# Patient Record
Sex: Female | Born: 1960 | Race: White | Hispanic: No | Marital: Married | Smoking: Never smoker
Health system: Southern US, Community
[De-identification: ages and names within clinical notes are randomized; demographics above are authoritative.]

## PROBLEM LIST (undated history)

## (undated) DIAGNOSIS — I10 Essential (primary) hypertension: Secondary | ICD-10-CM

## (undated) DIAGNOSIS — C801 Malignant (primary) neoplasm, unspecified: Secondary | ICD-10-CM

## (undated) DIAGNOSIS — T7840XA Allergy, unspecified, initial encounter: Secondary | ICD-10-CM

## (undated) DIAGNOSIS — M810 Age-related osteoporosis without current pathological fracture: Secondary | ICD-10-CM

## (undated) DIAGNOSIS — F419 Anxiety disorder, unspecified: Secondary | ICD-10-CM

## (undated) DIAGNOSIS — E785 Hyperlipidemia, unspecified: Secondary | ICD-10-CM

## (undated) HISTORY — DX: Hyperlipidemia, unspecified: E78.5

## (undated) HISTORY — DX: Age-related osteoporosis without current pathological fracture: M81.0

## (undated) HISTORY — PX: DILATION AND CURETTAGE, DIAGNOSTIC / THERAPEUTIC: SUR384

## (undated) HISTORY — DX: Anxiety disorder, unspecified: F41.9

## (undated) HISTORY — DX: Essential (primary) hypertension: I10

## (undated) HISTORY — PX: CHOLECYSTECTOMY: SHX55

## (undated) HISTORY — DX: Allergy, unspecified, initial encounter: T78.40XA

## (undated) HISTORY — DX: Malignant (primary) neoplasm, unspecified: C80.1

---

## 1997-09-10 ENCOUNTER — Other Ambulatory Visit: Admission: RE | Admit: 1997-09-10 | Discharge: 1997-09-10 | Payer: Self-pay | Admitting: Obstetrics and Gynecology

## 1999-06-22 ENCOUNTER — Encounter (INDEPENDENT_AMBULATORY_CARE_PROVIDER_SITE_OTHER): Payer: Self-pay

## 1999-06-22 ENCOUNTER — Inpatient Hospital Stay (HOSPITAL_COMMUNITY): Admission: AD | Admit: 1999-06-22 | Discharge: 1999-06-25 | Payer: Self-pay | Admitting: Obstetrics and Gynecology

## 1999-06-26 ENCOUNTER — Encounter: Admission: RE | Admit: 1999-06-26 | Discharge: 1999-08-01 | Payer: Self-pay | Admitting: Obstetrics and Gynecology

## 2002-06-12 ENCOUNTER — Encounter: Admission: RE | Admit: 2002-06-12 | Discharge: 2002-06-12 | Payer: Self-pay | Admitting: Obstetrics and Gynecology

## 2002-06-12 ENCOUNTER — Encounter: Payer: Self-pay | Admitting: Obstetrics and Gynecology

## 2002-12-01 ENCOUNTER — Other Ambulatory Visit: Admission: RE | Admit: 2002-12-01 | Discharge: 2002-12-01 | Payer: Self-pay | Admitting: Obstetrics and Gynecology

## 2003-12-27 ENCOUNTER — Other Ambulatory Visit: Admission: RE | Admit: 2003-12-27 | Discharge: 2003-12-27 | Payer: Self-pay | Admitting: Obstetrics and Gynecology

## 2005-02-22 ENCOUNTER — Other Ambulatory Visit: Admission: RE | Admit: 2005-02-22 | Discharge: 2005-02-22 | Payer: Self-pay | Admitting: Obstetrics and Gynecology

## 2006-09-20 ENCOUNTER — Encounter: Admission: RE | Admit: 2006-09-20 | Discharge: 2006-09-20 | Payer: Self-pay | Admitting: Obstetrics and Gynecology

## 2007-08-20 ENCOUNTER — Ambulatory Visit: Payer: Self-pay | Admitting: Cardiology

## 2007-08-28 ENCOUNTER — Ambulatory Visit: Payer: Self-pay

## 2007-08-28 ENCOUNTER — Ambulatory Visit: Payer: Self-pay | Admitting: Cardiology

## 2007-08-28 ENCOUNTER — Encounter: Payer: Self-pay | Admitting: Cardiology

## 2007-08-28 LAB — CONVERTED CEMR LAB: TSH: 1.45 microintl units/mL (ref 0.35–5.50)

## 2007-10-22 ENCOUNTER — Ambulatory Visit: Payer: Self-pay | Admitting: Cardiology

## 2008-05-21 ENCOUNTER — Encounter (INDEPENDENT_AMBULATORY_CARE_PROVIDER_SITE_OTHER): Payer: Self-pay | Admitting: Obstetrics and Gynecology

## 2008-05-21 ENCOUNTER — Ambulatory Visit (HOSPITAL_COMMUNITY): Admission: RE | Admit: 2008-05-21 | Discharge: 2008-05-21 | Payer: Self-pay | Admitting: Obstetrics and Gynecology

## 2008-08-02 ENCOUNTER — Telehealth (INDEPENDENT_AMBULATORY_CARE_PROVIDER_SITE_OTHER): Payer: Self-pay | Admitting: *Deleted

## 2009-02-12 HISTORY — PX: COLONOSCOPY: SHX174

## 2009-06-18 IMAGING — US UNKNOWN US STUDY
1 series · 3 of 3 positions shown · non-contrast
Comparison: none

DG DIAGNOSTIC UNILATERAL L
CC and MLO view(s) were taken of the left breast.

LEFT BREAST ULTRASOUND
DIGITAL UNILATERAL LEFT DIAGNOSTIC MAMMOGRAM AND LEFT BREAST ULTRASOUND:
CLINICAL DATA: 46-year-old with possible mass identified in the left breast on screening mammogram
September 12, 2006.

[Series 1: unknown us study · 3 of 3 slices shown]
[im 1/3]
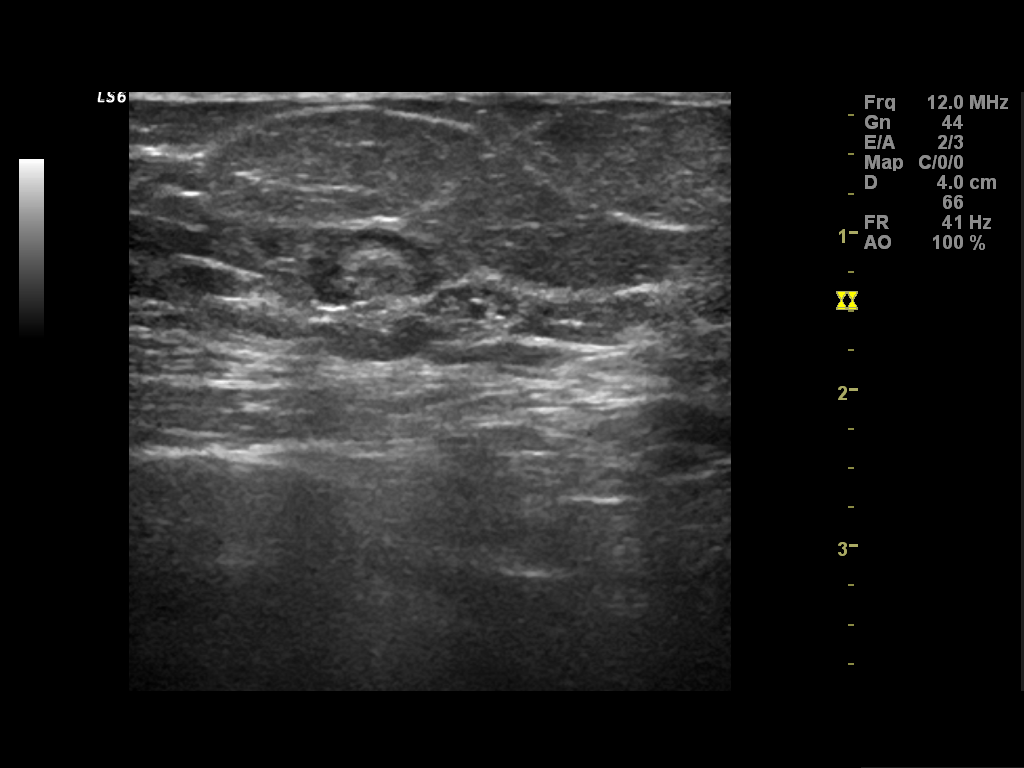
[im 2/3]
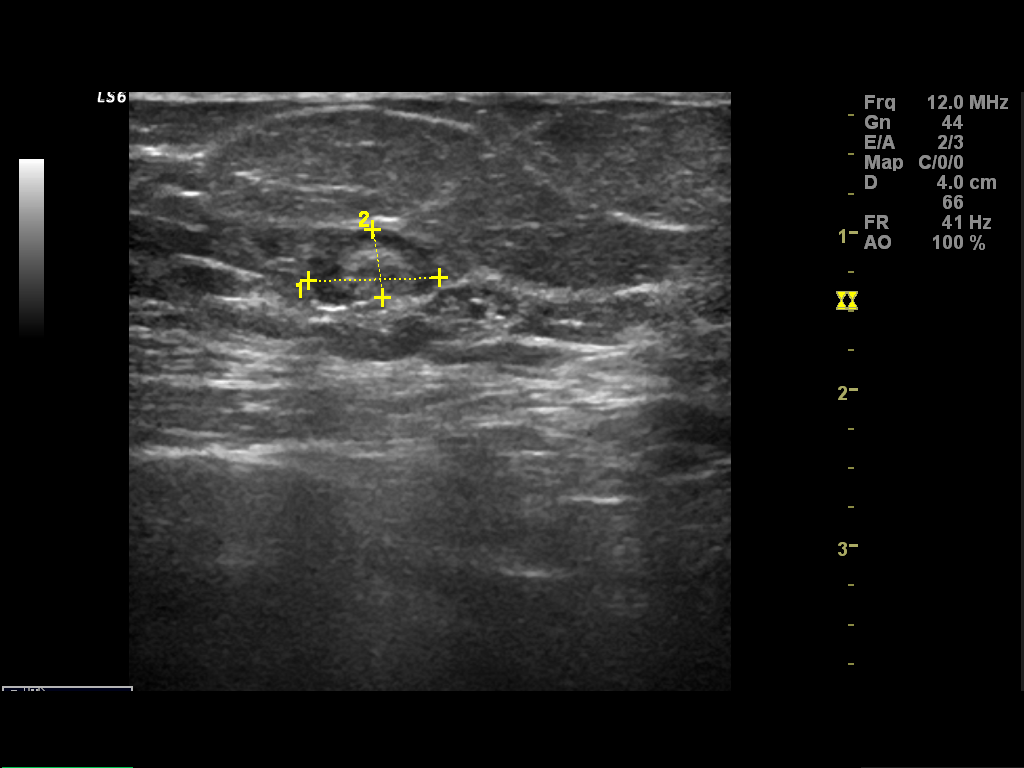
[im 3/3]
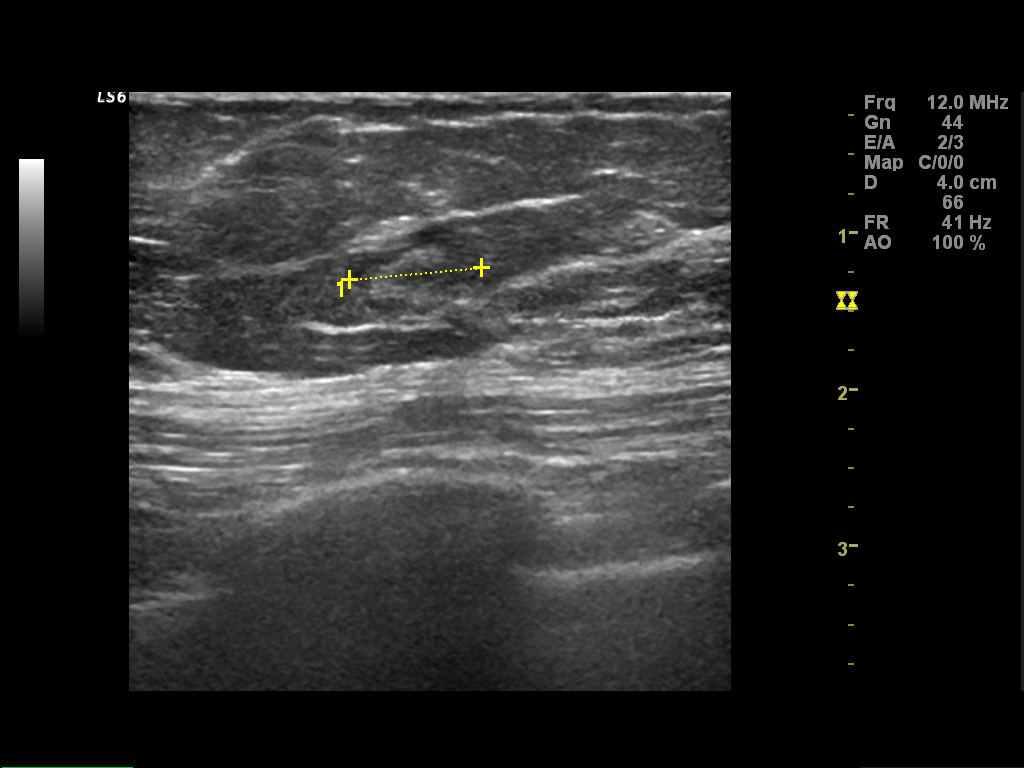

[3 of 3 positions shown; findings below may reference images not displayed]

Spot compression views of the upper outer quadrant of the right breast show heterogeneously dense 
breast parenchyma in the upper outer quadrant.  Particularly on the CC view, there is suggestion of
persistence of an oval-shaped mass that measures approximately 1 cm and has suggestion of a 
central fatty hilum, although the mass is obscured by overlapping fibroglandular breast tissue.

Physical examination of the upper outer quadrant of the left breast is unremarkable.

Ultrasound of the upper outer quadrant of the left breast shows a smoothly marginated oval-shaped 
mass with central echogenicity and peripheral hypoechogenicity consistent with an intramammary 
lymph node.  This mass measures 0.8 x 0.4 x 0.8 cm and is located at the 2 o'clock position 5 cm 
from the nipple.  This mass corresponds in size, shape and location to the finding seen on the 
recent screening mammogram.  No worrisome masses or cysts are identified in the upper outer 
quadrant on ultrasound.
IMPRESSION: The oval-shaped mass seen in the upper outer quadrant of the left breast on recent screening 
mammogram was confirmed to be an intramammary lymph node, a benign finding.

ASSESSMENT: Benign - BI-RADS 2

Screening mammogram of both breasts in 1 year.
,

## 2009-09-27 ENCOUNTER — Encounter (INDEPENDENT_AMBULATORY_CARE_PROVIDER_SITE_OTHER): Payer: Self-pay | Admitting: *Deleted

## 2009-11-01 ENCOUNTER — Encounter (INDEPENDENT_AMBULATORY_CARE_PROVIDER_SITE_OTHER): Payer: Self-pay | Admitting: *Deleted

## 2009-11-03 ENCOUNTER — Ambulatory Visit: Payer: Self-pay | Admitting: Internal Medicine

## 2009-11-14 ENCOUNTER — Ambulatory Visit: Payer: Self-pay | Admitting: Internal Medicine

## 2010-03-14 NOTE — Miscellaneous (Signed)
Summary: fam hx col ..em  Clinical Lists Changes  Medications: Added new medication of MOVIPREP 100 GM  SOLR (PEG-KCL-NACL-NASULF-NA ASC-C) As directed - Signed Rx of MOVIPREP 100 GM  SOLR (PEG-KCL-NACL-NASULF-NA ASC-C) As directed;  #1 x 0;  Signed;  Entered by: Clide Cliff RN;  Authorized by: Iva Boop MD, [facility];  Method used: Electronically to [facility] [address], [place], [place]  [address], Ph: [phone], Fax: [phone] Allergies: Added new allergy or adverse reaction of BACTRIM Observations: Added new observation of NKA: F (11/03/2009 8:54) Added new observation of ALLERGY REV: Done (11/03/2009 8:54)    Prescriptions: MOVIPREP 100 GM  SOLR (PEG-KCL-NACL-NASULF-NA ASC-C) As directed  #1 x 0   Entered by:   Clide Cliff RN   Authorized by:   Iva Boop MD, [facility]   Signed by:   Clide Cliff RN on 11/03/2009   Method used:   Electronically to        [facility] (retail)       [address]       [place], [place]  [address]       Ph: [phone]       Fax: [phone]   RxID:   [id number]

## 2010-03-14 NOTE — Letter (Signed)
Summary: Pre Visit Letter Revised  [facility]  [address] [place], [place] [address]   Phone: [phone]  Fax: [phone]    09/27/2009 MRN: [id number]  [facility] [address] [place], [place]  [address]              Procedure Date: November 14, 2009   Welcome to the Gastroenterology Division at [facility].    You are scheduled to see a nurse for your pre-procedure visit on September 22, 2011at 9:00am on the 3rd floor at [facility], [address].  We ask that you try to arrive at our office 15 minutes prior to your appointment time to allow for check-in.  Please take a minute to review the attached form.  If you answer "Yes" to one or more of the questions on the first page, we ask that you call the person listed at your earliest opportunity.  If you answer "No" to all of the questions, please complete the rest of the form and bring it to your appointment.    Your nurse visit will consist of discussing your medical and surgical history, your immediate family medical history, and your medications.    If you are unable to list all of your medications on the form, please bring the medication bottles to your appointment and we will list them.  We will need to be aware of both prescribed and over the counter drugs.  We will need to know exact dosage information as well.    Please be prepared to read and sign documents such as consent forms, a financial agreement, and acknowledgement forms.  If necessary, and with your consent, a friend or relative is welcome to sit-in on the nurse visit with you.  Please bring your insurance card so that we may make a copy of it.  If your insurance requires a referral to see a specialist, please bring your referral form from your primary care physician.  No co-pay is required for this nurse visit.     If you cannot keep your appointment, please call [phone] to cancel or reschedule prior to your appointment date.   This allows [place] the opportunity to schedule an appointment for another patient in need of care.   Thank you for choosing  Gastroenterology for your medical needs.  We appreciate the opportunity to care for you.  Please visit [place] at our website  to learn more about our practice.                     Sincerely,  The Gastroenterology Division

## 2010-03-14 NOTE — Procedures (Signed)
Summary: Colonoscopy  Patient: [name] Note: All result statuses are Final unless otherwise noted.  Tests: (1) Colonoscopy (COL)   COL Colonoscopy           DONE     [facility]     [address]     [place], [place]  [address]           COLONOSCOPY PROCEDURE REPORT           PATIENT:  Ana Ritter, Ana Ritter  MR#:  [id number]     BIRTHDATE:  1960/08/25, 49 yrs. old  GENDER:  female     ENDOSCOPIST:  Iva Boop, MD, [facility]     REF. BY:  Richardean Chimera, M.D.     PROCEDURE DATE:  11/14/2009     PROCEDURE:  Colonoscopy [address]     ASA CLASS:  Class I     INDICATIONS:  Routine Risk Screening She does have a family hx of     colon cancer in paternal grandmother (had it twice, in her 80's     then second time in 37's). No others with polyps or colon cancer.           MEDICATIONS:   Fentanyl 75 mcg IV, Versed 8 mg IV           DESCRIPTION OF PROCEDURE:   After the risks benefits and     alternatives of the procedure were thoroughly explained, informed     consent was obtained.  Digital rectal exam was performed and     revealed no abnormalities.   The LB CF-[id number] endoscope     was introduced through the anus and advanced to the cecum, which     was identified by both the appendix and ileocecal valve, without     limitations.  The quality of the prep was excellent, using     MoviPrep.  The instrument was then slowly withdrawn as the colon     was fully examined. Insertion: 3:04 minutes Withdrawal: 10:05     minutes     <<PROCEDUREIMAGES>>           FINDINGS:  A normal appearing cecum, ileocecal valve, and     appendiceal orifice were identified. The ascending, hepatic     flexure, transverse, splenic flexure, descending, sigmoid colon,     and rectum appeared unremarkable.   Retroflexed views in the     rectum revealed no abnormalities.    The scope was then withdrawn     from the patient and the procedure completed.           COMPLICATIONS:  None     ENDOSCOPIC  IMPRESSION:     1) Normal colonoscopy, excellent prep           REPEAT EXAM:  In 10 year(s) for routine screening colonoscopy.     (Only one second-degree relative with colon cancer).           Iva Boop, MD, [facility]           CC:  Richardean Chimera, MD     Olivia Canter, MD     The Patient           n.     Rosalie Doctor:   Iva Boop at 11/14/2009 11:53 AM           Jens Som, [id number]  Note: An exclamation mark (!) indicates a result that was not dispersed into the flowsheet. Document Creation Date: 11/14/2009  11:53 AM _______________________________________________________________________  (1) Order result status: Final Collection or observation date-time: 11/14/2009 11:38 Requested date-time:  Receipt date-time:  Reported date-time:  Referring Physician:   Ordering Physician: Stan Head [phone]) Specimen Source:  Source: Launa Grill Order Number: [id number] Lab site:   Appended Document: Colonoscopy    Clinical Lists Changes  Observations: Added new observation of COLONNXTDUE: 11/2019 (11/14/2009 14:21)

## 2010-03-14 NOTE — Letter (Signed)
Summary: Moviprep Instructions  [facility]  [address]   [place], [place] [address]   Phone: [phone]  Fax: [phone]       Ana Ritter    1960-03-08    MRN: [id number]        Procedure Day /Date: Monday, 11-14-09     Arrival Time: 10:30 a.m.     Procedure Time: 11:30 a.m.     Location of Procedure:                    x   [facility] (4th Floor)   PREPARATION FOR COLONOSCOPY WITH MOVIPREP   Starting 5 days prior to your procedure 11-09-09  do not eat nuts, seeds, popcorn, corn, beans, peas,  salads, or any raw vegetables.  Do not take any fiber supplements (e.g. Metamucil, Citrucel, and Benefiber).  THE DAY BEFORE YOUR PROCEDURE         DATE: 11-13-09   DAY: Sunday  1.  Drink clear liquids the entire day-NO SOLID FOOD  2.  Do not drink anything colored red or purple.  Avoid juices with pulp.  No orange juice.  3.  Drink at least 64 oz. (8 glasses) of fluid/clear liquids during the day to prevent dehydration and help the prep work efficiently.  CLEAR LIQUIDS INCLUDE: Water Jello Ice Popsicles Tea (sugar ok, no milk/cream) Powdered fruit flavored drinks Coffee (sugar ok, no milk/cream) Gatorade Juice: apple, white grape, white cranberry  Lemonade Clear bullion, consomm, broth Carbonated beverages (any kind) Strained chicken noodle soup Hard Candy                             4.  In the morning, mix first dose of MoviPrep solution:    Empty 1 Pouch A and 1 Pouch B into the disposable container    Add lukewarm drinking water to the top line of the container. Mix to dissolve    Refrigerate (mixed solution should be used within 24 hrs)  5.  Begin drinking the prep at 5:00 p.m. The MoviPrep container is divided by 4 marks.   Every 15 minutes drink the solution down to the next mark (approximately 8 oz) until the full liter is complete.   6.  Follow completed prep with 16 oz of clear liquid of your choice (Nothing red or purple).   Continue to drink clear liquids until bedtime.  7.  Before going to bed, mix second dose of MoviPrep solution:    Empty 1 Pouch A and 1 Pouch B into the disposable container    Add lukewarm drinking water to the top line of the container. Mix to dissolve    Refrigerate  THE DAY OF YOUR PROCEDURE      DATE: 11-14-09 DAY: Monday  Beginning at 6:30 a.m. (5 hours before procedure):         1. Every 15 minutes, drink the solution down to the next mark (approx 8 oz) until the full liter is complete.  2. Follow completed prep with 16 oz. of clear liquid of your choice.    3. You may drink clear liquids until  9:30 a.m.  (2 HOURS BEFORE PROCEDURE).   MEDICATION INSTRUCTIONS  Unless otherwise instructed, you should take regular prescription medications with a small sip of water   as early as possible the morning of your procedure.           OTHER INSTRUCTIONS  You will  need a responsible adult at least 48 years of age to accompany you and drive you home.   This person must remain in the waiting room during your procedure.  Wear loose fitting clothing that is easily removed.  Leave jewelry and other valuables at home.  However, you may wish to bring a book to read or  an iPod/MP3 player to listen to music as you wait for your procedure to start.  Remove all body piercing jewelry and leave at home.  Total time from sign-in until discharge is approximately 2-3 hours.  You should go home directly after your procedure and rest.  You can resume normal activities the  day after your procedure.  The day of your procedure you should not:   Drive   Make legal decisions   Operate machinery   Drink alcohol   Return to work  You will receive specific instructions about eating, activities and medications before you leave.    The above instructions have been reviewed and explained to me by   Clide Cliff, RN_____________________    I fully understand and can verbalize  these instructions _____________________________ Date _________

## 2010-05-24 LAB — CBC
HCT: 41.5 % (ref 36.0–46.0)
Hemoglobin: 14.2 g/dL (ref 12.0–15.0)
RBC: 4.23 MIL/uL (ref 3.87–5.11)
RDW: 12.7 % (ref 11.5–15.5)

## 2010-05-24 LAB — HCG, SERUM, QUALITATIVE: Preg, Serum: NEGATIVE

## 2010-06-27 NOTE — Assessment & Plan Note (Signed)
[facility]                            CARDIOLOGY OFFICE NOTE   NAME:Alden, KALIKA SMAY                      MRN:          [id number]  DATE:08/20/2007                            DOB:          03-08-1960    HISTORY OF PRESENT ILLNESS:  The patient is a very pleasant 50 year old  female with no prior cardiac history who I am asked to evaluate for  palpitations.  Note, she typically does not have dyspnea on exertion,  orthopnea, PND, pedal edema, presyncope, syncope, or exertional chest  pain.  Over the past several months, she has had intermittent  palpitations.  She does state that she has been under stress recently  with her husband's salary decreasing due to loss of business, and she  also has gone back to work but has been working in [employer] for  approximately 25 years.  She states that she has had 6 or 7 different  episodes of palpitations over that time.  They are sudden in onset and  described as heart racing.  There is no associated chest pain, shortness  of breath, or presyncope.  These typically last for approximately 2  minutes and then resolved spontaneously.  They are not related to  exertion.  She feels fine afterwards.  Because of the above, we were  asked to further evaluate.   MEDICATIONS AT PRESENT:  Multivitamin, flaxseed, and melatonin.  She  also takes Motrin and Aleve as needed.   ALLERGIES:  She has an allergy to BACTRIM.   SOCIAL HISTORY:  She has a remote history of tobacco use but has not  smoked since 1996.  She occasionally consumes alcohol.  She drinks  significant amounts of caffeine.   FAMILY HISTORY:  Negative for coronary artery disease.   PAST MEDICAL HISTORY:  There is no diabetes mellitus or hypertension,  and there is no hyperlipidemia.  She has had a prior cholecystectomy.  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   REVIEW OF SYSTEMS:  She denies any headaches, fevers,  or chills.  There  is no productive cough or hemoptysis.  There is no dysphagia,  odynophagia, melena, or hematochezia.  There is no dysuria or hematuria.  There is no rashes or seizure activity.  There is no orthopnea, PND, or  pedal edema.  The remaining systems are negative.   PHYSICAL EXAMINATION:  VITAL SIGNS:  Blood pressure 133/82 and pulse is  97.  GENERAL:  She is well-developed, well-nourished in no acute distress.  SKIN:  Warm and dry.  She is not appeared to be depressed.  There is no  peripheral clubbing.  BACK:  Normal.  HEENT:  Normal with normal eyelids.  NECK:  Supple with normal upstroke bilaterally.  There are no bruits  noted.  There is no jugular venous distention.  I cannot appreciate  thyromegaly.  CHEST:  Chest is clear to auscultation and expansion.  CARDIOVASCULAR:  Regular rate and rhythm.  Normal S1 and S2.  There are  no murmurs, rubs, or gallops noted.  There is no change  with Valsalva.  ABDOMEN:  Nontender, nondistended, and Positive bowel sounds.  No  hepatosplenomegaly.  No mass appreciated.  There is no abdominal bruit.  EXTREMITIES:  She has 2+ femoral pulses bilaterally.  No bruits.  Extremities showed no edema.  I could palpate no cords.  She has 2+  dorsalis pedis pulses bilaterally.  NEUROLOGIC:  Grossly intact.   Her electrocardiogram today shows a sinus rhythm at a rate of 97.  The  axis is normal.  There are no significant ST changes.  There is no delta  wave noted.   DIAGNOSES:  1. Palpitations - Ms. Kwiatek's description sounds concerning for an      supraventricular tachycardia.  We will plan to proceed with a      CardioNet monitor.  If identified, then we can either add a beta      blocker or consider referral for ablation in the future.  We will      check an echocardiogram to quantify left ventricular function as      well.  We will also check a TSH.  I have instructed her to decrease      her caffeine intake as much as possible.   We will see her back in      approximately 4 weeks to review her echo and her CardioNet monitor.     Madolyn Frieze Jens Som, MD, [facility]  Electronically Signed    BSC/MedQ  DD: 08/20/2007  DT: 08/21/2007  Job #: [id number]   cc:   Juluis Mire, M.D.  Olivia Canter, MD

## 2010-06-27 NOTE — Op Note (Signed)
Ana Ritter, Ana Ritter               ACCOUNT NO.:  [id number]   MEDICAL RECORD NO.:  [id number]          PATIENT TYPE:  AMB   LOCATION:  SDC                           FACILITY:  [facility]   PHYSICIAN:  Juluis Mire, M.D.   DATE OF BIRTH:  [birth year]   DATE OF PROCEDURE:  05/21/2008  DATE OF DISCHARGE:                               OPERATIVE REPORT   PREOPERATIVE DIAGNOSIS:  Endometrial polyp.   POSTOPERATIVE DIAGNOSIS:  Endometrial polyp.   PROCEDURE:  Cervical dilatation, hysteroscopic evaluation with multiple  endometrial biopsies using curette.   SURGEON:  Juluis Mire, MD   ANESTHESIA:  General.   ESTIMATED BLOOD LOSS:  Minimal.   PACKS AND DRAINS:  None.   INTRAOPERATIVE BLOOD PLACED:  None.   COMPLICATIONS:  None.   INDICATIONS:  Are dictated in history and physical.   PROCEDURE IN DETAIL:  The patient was taken to the OR and placed in  supine position.  After satisfactory level of general anesthesia  obtained, the patient was placed in dorsal position using the Allen  stirrups.  The perineum and vagina prepped out with Betadine and draped  in sterile field.  A speculum was placed in the vaginal vault.  The  cervix was grasped with a single-tooth tenaculum.  A paracervical block  1% Xylocaine was instituted.  Uterus sounded to 10 cm.  Cervix serially  dilated to a size of 35 Pratt dilator.  Hysteroscope was then  introduced.  Visualization revealed normal endometrium.  The polyp may  had been disrupted by the dilation.  We did multiple endometrial  biopsies along with curettings.  These were all sent for pathology.  Total deficit was 70 mL.  The single-tooth tenaculum and speculum were  removed.  The patient was taken out of dorsal position, once alert and  extubated, transferred to recovery room in good condition.  Sponge,  instrument, and needle counts were correct by circulating nurse.      Juluis Mire, M.D.  Electronically Signed     JSM/MEDQ  D:   05/21/2008  T:  05/21/2008  Job:  [id number]

## 2010-06-27 NOTE — H&P (Signed)
NAME:  Ana Ritter, Ana Ritter               ACCOUNT NO.:  [id number]   MEDICAL RECORD NO.:  [id number]          PATIENT TYPE:  AMB   LOCATION:  SDC                           [facility]ILITY:  [facility]   PHYSICIAN:  Juluis Mire, M.D.   DATE OF BIRTH:  1960/05/04   DATE OF ADMISSION:  05/21/2008  DATE OF DISCHARGE:                              HISTORY & PHYSICAL   The patient is a 50 year old gravida 4, para 2, abortus 2 female who  presents for hysteroscopy.   In relation to the present admission, the patient's last Pap smear did  reveal endometrial cells.  The saline infusion ultrasound revealed  evidence of a large endometrial polyp.  In view of this, we have resumed  for a hysteroscopic resection.   In terms of allergies, she is allergic to BACTRIM.   MEDICATIONS:  She is on melatonin 3 mg at night, calcium replacement,  flaxseed oil.  She is on a multivitamin, progesterone cream.  She uses  Premarin vaginal cream as well as a steroid ointment.   PAST MEDICAL HISTORY:  Usual childhood disease.  No significant  sequelae.  No previous surgical history.   OBSTETRICAL HISTORY:  Two vaginal deliveries.   FAMILY HISTORY:  Noncontributory.   SOCIAL HISTORY:  Reveals no tobacco or alcohol use.   REVIEW OF SYSTEMS:  Noncontributory.   PHYSICAL EXAMINATION:  VITAL SIGNS:  The patient is afebrile with stable  vital signs.  HEENT:  The patient is normocephalic.  Pupils are equal, round, and  reactive to light and accommodation.  Extraocular movements were intact.  Sclerae and conjunctivae are clear.  Oropharynx clear.  NECK:  Without thyromegaly.  BREASTS:  Not examined.  LUNGS:  Clear.  CARDIOVASCULAR SYSTEM:  Regular rhythm and rate, without murmurs or  gallops.  ABDOMEN:  Benign.  No mass, organomegaly, or tenderness.  PELVIC:  Normal external genitalia.  Vaginal mucosa is clear.  Cervix  unremarkable.  Uterus normal size, shape, and contour.  Adnexa free of  masses or tenderness.  EXTREMITIES:  Trace edema.  NEUROLOGIC:  Grossly within normal limits.   IMPRESSION:  Endometrial polyp.   PLAN:  The patient to undergo hysteroscopic resection and the nature of  the procedure have been discussed.  The risks have been explained.  The  risks include the risk of infection, risk of vascular injury that could  lead to hemorrhage requiring transfusion with the risk of AIDS or  hepatitis.  Excessive bleeding could require  hysterectomy.  There is a risk of perforation leading to injury to  adjacent organs requiring further exploratory surgery.  Risk of deep  venous thrombosis and pulmonary embolus.  The patient expressed an  understanding of risk as well as indications.      Juluis Mire, M.D.  Electronically Signed     JSM/MEDQ  D:  05/21/2008  T:  05/21/2008  Job:  [id number]

## 2010-06-27 NOTE — Assessment & Plan Note (Signed)
[facility]                            CARDIOLOGY OFFICE NOTE   NAME:Ana Ritter, Ana Ritter                      MRN:          [id number]  DATE:10/22/2007                            DOB:          [birth year]    Ana Ritter is an extremely pleasant 50 year old female who I recently  saw on August 20, 2007 secondary to the palpitations.  We did schedule her  to an echocardiogram which was performed on August 28, 2007.  Her LV  function was normal.  There was no significant valvular abnormalities.  She also had a TSH which was normal at 1.45.  We did place a CardioNet  monitor.  She had significant problems with lead sticking.  She states  she works for 4-5 days.  She thinks that one night she may have had  palpitations but is not clear that she had her previous palpitations  while the monitor was in place.  This did show sinus to sinus  tachycardia.  There were no significant arrhythmias.  She does state  that she has decreased her caffeine intake and her palpitations have  improved.  She denies any dyspnea, chest pain, or syncope.  She did have  a fall over the Labor Day weekend tripping over steps.  She displaced 2  teeth and fractured her mandible.   MEDICATIONS:  1. Flaxseed.  2. Melatonin.  3. Vitamin E.  4. [facility] [employer] [facility].  5. Pure Progesterone cream b.i.d.  6. Amoxicillin.   PHYSICAL EXAMINATION:  VITAL SIGNS:  Today shows a blood pressure of  110/74 and pulse of 87.  She weighs 160 pounds.  HEENT:  Normal.  NECK:  Supple.  CHEST:  Clear.  CARDIOVASCULAR:  Regular rate and rhythm.  ABDOMEN:  No tenderness.  EXTREMITIES:  No tenderness.   DIAGNOSES:  Palpitations - Ana Ritter's palpitations have markedly  improved.  We did not identify an arrhythmia with her CardioNet monitor  but it is not clear to me that she had the palpitations while it was in  place.  Given that they improve, we have elected to follow this  expectantly.  If they  worsen in the future, then we could try a beta-  blocker versus a repeating her monitor.  She is in agreement with that.  She thinks that a significant amount of this may be  related to stress.  We have asked her to come back on an as needed  basis.  If she does have worsening palpitations, then she will contact  us.     Ana Frieze Jens Som, MD, [facility]  Electronically Signed    BSC/MedQ  DD: 10/22/2007  DT: 10/23/2007  Job #: [id number]   cc:   Ana Ritter, M.D.  Ana Canter, MD

## 2013-09-24 ENCOUNTER — Encounter: Payer: Self-pay | Admitting: Internal Medicine

## 2014-04-29 ENCOUNTER — Other Ambulatory Visit: Payer: Self-pay | Admitting: Obstetrics and Gynecology

## 2014-05-03 LAB — CYTOLOGY - PAP

## 2019-09-05 DIAGNOSIS — N3281 Overactive bladder: Secondary | ICD-10-CM | POA: Insufficient documentation

## 2019-09-05 DIAGNOSIS — E559 Vitamin D deficiency, unspecified: Secondary | ICD-10-CM | POA: Insufficient documentation

## 2019-09-05 DIAGNOSIS — M9390 Osteochondropathy, unspecified of unspecified site: Secondary | ICD-10-CM | POA: Insufficient documentation

## 2019-09-05 DIAGNOSIS — H04129 Dry eye syndrome of unspecified lacrimal gland: Secondary | ICD-10-CM | POA: Insufficient documentation

## 2019-09-05 DIAGNOSIS — J309 Allergic rhinitis, unspecified: Secondary | ICD-10-CM | POA: Insufficient documentation

## 2019-09-05 DIAGNOSIS — E782 Mixed hyperlipidemia: Secondary | ICD-10-CM | POA: Insufficient documentation

## 2019-09-05 DIAGNOSIS — I10 Essential (primary) hypertension: Secondary | ICD-10-CM | POA: Insufficient documentation

## 2019-09-05 DIAGNOSIS — M810 Age-related osteoporosis without current pathological fracture: Secondary | ICD-10-CM | POA: Insufficient documentation

## 2019-09-05 DIAGNOSIS — F5104 Psychophysiologic insomnia: Secondary | ICD-10-CM | POA: Insufficient documentation

## 2019-11-04 ENCOUNTER — Encounter: Payer: Self-pay | Admitting: Obstetrics and Gynecology

## 2020-03-10 ENCOUNTER — Encounter: Payer: Self-pay | Admitting: Internal Medicine

## 2020-04-01 ENCOUNTER — Encounter: Payer: Self-pay | Admitting: Internal Medicine

## 2020-04-20 ENCOUNTER — Encounter: Payer: Self-pay | Admitting: *Deleted

## 2020-05-06 ENCOUNTER — Ambulatory Visit (AMBULATORY_SURGERY_CENTER): Payer: Self-pay

## 2020-05-06 ENCOUNTER — Other Ambulatory Visit: Payer: Self-pay

## 2020-05-06 VITALS — Ht 66.0 in | Wt 162.0 lb

## 2020-05-06 DIAGNOSIS — Z1211 Encounter for screening for malignant neoplasm of colon: Secondary | ICD-10-CM

## 2020-05-06 NOTE — Progress Notes (Signed)
No allergies to soy or egg Pt is not on blood thinners or diet pills Denies issues with sedation/intubation Denies atrial flutter/fib Denies constipation   Pt is aware of Covid safety and care partner requirements.      

## 2020-05-18 ENCOUNTER — Encounter: Payer: Self-pay | Admitting: Internal Medicine

## 2020-05-20 ENCOUNTER — Ambulatory Visit (AMBULATORY_SURGERY_CENTER): Payer: BC Managed Care – PPO | Admitting: Internal Medicine

## 2020-05-20 ENCOUNTER — Encounter: Payer: Self-pay | Admitting: Internal Medicine

## 2020-05-20 ENCOUNTER — Other Ambulatory Visit: Payer: Self-pay

## 2020-05-20 VITALS — BP 112/75 | HR 73 | Temp 97.1°F | Resp 12 | Ht 66.0 in | Wt 162.0 lb

## 2020-05-20 DIAGNOSIS — Z1211 Encounter for screening for malignant neoplasm of colon: Secondary | ICD-10-CM

## 2020-05-20 MED ORDER — SODIUM CHLORIDE 0.9 % IV SOLN: 500.0000 mL | Freq: Once | INTRAVENOUS | Status: AC

## 2020-05-20 NOTE — Progress Notes (Signed)
To PACU, VSS. Report to Rn.tb 

## 2020-05-20 NOTE — Patient Instructions (Addendum)
No polyps or cancer identified.  You do have the condition known as diverticulosis.  It is okay to eat seeds nuts and popcorn still.  Please read the handout about that.  Next routine colonoscopy due in 10 years.  I appreciate the opportunity to care for you. Gatha Mayer, MD, FACG YOU HAD AN ENDOSCOPIC PROCEDURE TODAY AT [facility]:   Refer to the procedure report that was given to you for any specific questions about what was found during the examination.  If the procedure report does not answer your questions, please call your gastroenterologist to clarify.  If you requested that your care partner not be given the details of your procedure findings, then the procedure report has been included in a sealed envelope for you to review at your convenience later.  YOU SHOULD EXPECT: Some feelings of bloating in the abdomen. Passage of more gas than usual.  Walking can help get rid of the air that was put into your GI tract during the procedure and reduce the bloating. If you had a lower endoscopy (such as a colonoscopy or flexible sigmoidoscopy) you may notice spotting of blood in your stool or on the toilet paper. If you underwent a bowel prep for your procedure, you may not have a normal bowel movement for a few days.  Please Note:  You might notice some irritation and congestion in your nose or some drainage.  This is from the oxygen used during your procedure.  There is no need for concern and it should clear up in a day or so.  SYMPTOMS TO REPORT IMMEDIATELY:   Following lower endoscopy (colonoscopy or flexible sigmoidoscopy):  Excessive amounts of blood in the stool  Significant tenderness or worsening of abdominal pains  Swelling of the abdomen that is new, acute  Fever of 100F or higher   For urgent or emergent issues, a gastroenterologist can be reached at any hour by calling [phone]. Do not use MyChart messaging for urgent concerns.    DIET:  We do  recommend a small meal at first, but then you may proceed to your regular diet.  Drink plenty of fluids but you should avoid alcoholic beverages for 24 hours.  MEDICATIONS: Continue present medications.  Please see handouts given to you by your recovery nurse.  Thank you for allowing [place] to provide for your healthcare needs today.  ACTIVITY:  You should plan to take it easy for the rest of today and you should NOT DRIVE or use heavy machinery until tomorrow (because of the sedation medicines used during the test).    FOLLOW UP: Our staff will call the number listed on your records 48-72 hours following your procedure to check on you and address any questions or concerns that you may have regarding the information given to you following your procedure. If we do not reach you, we will leave a message.  We will attempt to reach you two times.  During this call, we will ask if you have developed any symptoms of COVID 19. If you develop any symptoms (ie: fever, flu-like symptoms, shortness of breath, cough etc.) before then, please call [phone].  If you test positive for Covid 19 in the 2 weeks post procedure, please call and report this information to [place].    If any biopsies were taken you will be contacted by phone or by letter within the next 1-3 weeks.  Please call us at [phone] if you have not heard about  the biopsies in 3 weeks.    SIGNATURES/CONFIDENTIALITY: You and/or your care partner have signed paperwork which will be entered into your electronic medical record.  These signatures attest to the fact that that the information above on your After Visit Summary has been reviewed and is understood.  Full responsibility of the confidentiality of this discharge information lies with you and/or your care-partner.

## 2020-05-20 NOTE — Op Note (Signed)
[place] Patient Name: Ana Ritter Procedure Date: 05/20/2020 7:54 AM MRN: [id number] Endoscopist: Gatha Mayer , MD Age: 60 Referring MD:  Date of Birth: [birth year] Gender: Female Account #: [id number] Procedure:                Colonoscopy Indications:              Screening for colorectal malignant neoplasm, Last                            colonoscopy: 2011 Medicines:                Propofol per Anesthesia, Monitored Anesthesia Care Procedure:                Pre-Anesthesia Assessment:                           - Prior to the procedure, a History and Physical                            was performed, and patient medications and                            allergies were reviewed. The patient's tolerance of                            previous anesthesia was also reviewed. The risks                            and benefits of the procedure and the sedation                            options and risks were discussed with the patient.                            All questions were answered, and informed consent                            was obtained. Prior Anticoagulants: The patient has                            taken no previous anticoagulant or antiplatelet                            agents. ASA Grade Assessment: II - A patient with                            mild systemic disease. After reviewing the risks                            and benefits, the patient was deemed in                            satisfactory condition to undergo the procedure.  After obtaining informed consent, the colonoscope                            was passed under direct vision. Throughout the                            procedure, the patient's blood pressure, pulse, and                            oxygen saturations were monitored continuously. The                            Olympus PFC-H190DL [id number]) Colonoscope was                            introduced through the  anus and advanced to the the                            cecum, identified by appendiceal orifice and                            ileocecal valve. The colonoscopy was performed                            without difficulty. The patient tolerated the                            procedure well. The quality of the bowel                            preparation was good. The bowel preparation used                            was Miralax via split dose instruction. The                            ileocecal valve, appendiceal orifice, and rectum                            were photographed. Scope In: 8:09:29 AM Scope Out: 8:22:33 AM Scope Withdrawal Time: 0 hours 10 minutes 55 seconds  Total Procedure Duration: 0 hours 13 minutes 4 seconds  Findings:                 The perianal and digital rectal examinations were                            normal.                           Multiple small and large-mouthed diverticula were                            found in the sigmoid colon, descending colon and  transverse colon.                           The exam was otherwise without abnormality on                            direct and retroflexion views. Complications:            No immediate complications. Estimated Blood Loss:     Estimated blood loss: none. Impression:               - Diverticulosis in the sigmoid colon, in the                            descending colon and in the transverse colon.                           - The examination was otherwise normal on direct                            and retroflexion views.                           - No specimens collected. Recommendation:           - Patient has a contact number available for                            emergencies. The signs and symptoms of potential                            delayed complications were discussed with the                            patient. Return to normal activities tomorrow.                             Written discharge instructions were provided to the                            patient.                           - Resume previous diet.                           - Continue present medications.                           - Repeat colonoscopy in 10 years for screening                            purposes. Gatha Mayer, MD 05/20/2020 8:30:38 AM This report has been signed electronically.

## 2020-05-24 ENCOUNTER — Telehealth: Payer: Self-pay | Admitting: *Deleted

## 2020-05-24 NOTE — Telephone Encounter (Signed)
  Follow up Call-  Call back number 05/20/2020  Post procedure Call Back phone  # [phone]  Permission to leave phone message Yes  Some recent data might be hidden     Patient questions:  Do you have a fever, pain , or abdominal swelling? No. Pain Score  0 *  Have you tolerated food without any problems? Yes.    Have you been able to return to your normal activities? Yes.    Do you have any questions about your discharge instructions: Diet   No. Medications  No. Follow up visit  No.  Do you have questions or concerns about your Care? No.  Actions: * If pain score is 4 or above: No action needed, pain <4.  1. Have you developed a fever since your procedure? no  2.   Have you had an respiratory symptoms (SOB or cough) since your procedure? no  3.   Have you tested positive for COVID 19 since your procedure no  4.   Have you had any family members/close contacts diagnosed with the COVID 19 since your procedure?  no   If yes to any of these questions please route to Joylene John, RN and Joella Prince, RN

## 2020-05-24 NOTE — Telephone Encounter (Signed)
No answer for post procedure call back. Left VM. 

## 2023-09-10 ENCOUNTER — Other Ambulatory Visit: Payer: Self-pay | Admitting: Obstetrics and Gynecology

## 2023-09-10 DIAGNOSIS — Z8249 Family history of ischemic heart disease and other diseases of the circulatory system: Secondary | ICD-10-CM

## 2024-02-20 ENCOUNTER — Ambulatory Visit
Admission: RE | Admit: 2024-02-20 | Discharge: 2024-02-20 | Disposition: A | Discharge status: Home / Self Care | Source: Ambulatory Visit | Attending: Obstetrics and Gynecology | Admitting: Obstetrics and Gynecology

## 2024-02-20 DIAGNOSIS — Z8249 Family history of ischemic heart disease and other diseases of the circulatory system: Secondary | ICD-10-CM
